# Patient Record
Sex: Female | Born: 2000 | Race: White | Hispanic: No | Marital: Single
Health system: Southern US, Community
[De-identification: ages and names within clinical notes are randomized; demographics above are authoritative.]

---
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
## 2019-02-02 ENCOUNTER — Other Ambulatory Visit: Payer: Self-pay

## 2019-02-02 DIAGNOSIS — Z20822 Contact with and (suspected) exposure to covid-19: Secondary | ICD-10-CM

## 2019-02-03 LAB — NOVEL CORONAVIRUS, NAA: SARS-CoV-2, NAA: NOT DETECTED

## 2019-02-04 ENCOUNTER — Telehealth: Payer: Self-pay | Admitting: General Practice

## 2019-02-04 NOTE — Telephone Encounter (Signed)
Negative COVID results given. Patient results "NOT Detected." Caller expressed understanding. ° °

## 2019-04-16 ENCOUNTER — Other Ambulatory Visit: Payer: Self-pay

## 2019-04-16 DIAGNOSIS — Z20822 Contact with and (suspected) exposure to covid-19: Secondary | ICD-10-CM

## 2019-04-18 ENCOUNTER — Telehealth: Payer: Self-pay

## 2019-04-18 LAB — NOVEL CORONAVIRUS, NAA: SARS-CoV-2, NAA: DETECTED — AB

## 2019-04-18 NOTE — Telephone Encounter (Signed)
Patient called in requesting Gay lab results and MyChart setup assistance - DOB/Address verified - Positive results given.  Patient reports having no symptoms presently. Reviewed Patient Positive test results protocol with patient, no further questions.
